# Patient Record
Sex: Male | Born: 1970 | Race: White | Hispanic: No | Marital: Married | Smoking: Never smoker
Health system: Southern US, Community
[De-identification: ages and names within clinical notes are randomized; demographics above are authoritative.]

## PROBLEM LIST (undated history)

## (undated) DIAGNOSIS — M419 Scoliosis, unspecified: Secondary | ICD-10-CM

## (undated) HISTORY — PX: BACK SURGERY: SHX140

---

## 2015-07-01 ENCOUNTER — Emergency Department: Payer: Worker's Compensation

## 2015-07-01 ENCOUNTER — Emergency Department
Admission: EM | Admit: 2015-07-01 | Discharge: 2015-07-01 | Disposition: A | Discharge status: Home / Self Care | Payer: Worker's Compensation | Attending: Emergency Medicine | Admitting: Emergency Medicine

## 2015-07-01 DIAGNOSIS — T148XXA Other injury of unspecified body region, initial encounter: Secondary | ICD-10-CM

## 2015-07-01 DIAGNOSIS — S7012XA Contusion of left thigh, initial encounter: Secondary | ICD-10-CM | POA: Diagnosis not present

## 2015-07-01 DIAGNOSIS — Y9289 Other specified places as the place of occurrence of the external cause: Secondary | ICD-10-CM | POA: Diagnosis not present

## 2015-07-01 DIAGNOSIS — M419 Scoliosis, unspecified: Secondary | ICD-10-CM | POA: Insufficient documentation

## 2015-07-01 DIAGNOSIS — W14XXXA Fall from tree, initial encounter: Secondary | ICD-10-CM | POA: Insufficient documentation

## 2015-07-01 DIAGNOSIS — Y998 Other external cause status: Secondary | ICD-10-CM | POA: Diagnosis not present

## 2015-07-01 DIAGNOSIS — Y9389 Activity, other specified: Secondary | ICD-10-CM | POA: Insufficient documentation

## 2015-07-01 DIAGNOSIS — W19XXXA Unspecified fall, initial encounter: Secondary | ICD-10-CM

## 2015-07-01 DIAGNOSIS — S7011XA Contusion of right thigh, initial encounter: Secondary | ICD-10-CM | POA: Insufficient documentation

## 2015-07-01 DIAGNOSIS — G8929 Other chronic pain: Secondary | ICD-10-CM | POA: Diagnosis not present

## 2015-07-01 DIAGNOSIS — S3992XA Unspecified injury of lower back, initial encounter: Secondary | ICD-10-CM | POA: Diagnosis present

## 2015-07-01 DIAGNOSIS — S300XXA Contusion of lower back and pelvis, initial encounter: Secondary | ICD-10-CM | POA: Insufficient documentation

## 2015-07-01 HISTORY — DX: Scoliosis, unspecified: M41.9

## 2015-07-01 LAB — BASIC METABOLIC PANEL
Anion gap: 3 — ABNORMAL LOW (ref 5–15)
BUN: 19 mg/dL (ref 6–20)
CALCIUM: 8.5 mg/dL — AB (ref 8.9–10.3)
CO2: 26 mmol/L (ref 22–32)
CREATININE: 0.65 mg/dL (ref 0.61–1.24)
Chloride: 107 mmol/L (ref 101–111)
Glucose, Bld: 98 mg/dL (ref 65–99)
Potassium: 3.9 mmol/L (ref 3.5–5.1)
SODIUM: 136 mmol/L (ref 135–145)

## 2015-07-01 LAB — CBC
HCT: 34.4 % — ABNORMAL LOW (ref 40.0–52.0)
Hemoglobin: 11.8 g/dL — ABNORMAL LOW (ref 13.0–18.0)
MCH: 29.2 pg (ref 26.0–34.0)
MCHC: 34.3 g/dL (ref 32.0–36.0)
MCV: 85 fL (ref 80.0–100.0)
PLATELETS: 256 10*3/uL (ref 150–440)
RBC: 4.05 MIL/uL — AB (ref 4.40–5.90)
RDW: 13.9 % (ref 11.5–14.5)
WBC: 9.6 10*3/uL (ref 3.8–10.6)

## 2015-07-01 MED ORDER — DIATRIZOATE MEGLUMINE & SODIUM 66-10 % PO SOLN
15.0000 mL | Freq: Once | ORAL | Status: AC
  Administered 2015-07-01: 15 mL via ORAL

## 2015-07-01 MED ORDER — HYDROCODONE-ACETAMINOPHEN 5-325 MG PO TABS: 1.0000 | ORAL_TABLET | Freq: Four times a day (QID) | ORAL | Status: DC | PRN

## 2015-07-01 MED ORDER — HYDROCODONE-ACETAMINOPHEN 5-325 MG PO TABS
ORAL_TABLET | ORAL | Status: AC
  Filled 2015-07-01: qty 2

## 2015-07-01 MED ORDER — HYDROCODONE-ACETAMINOPHEN 5-325 MG PO TABS
2.0000 | ORAL_TABLET | ORAL | Status: AC
  Administered 2015-07-01: 2 via ORAL

## 2015-07-01 MED ORDER — IOPAMIDOL (ISOVUE-300) INJECTION 61%
100.0000 mL | Freq: Once | INTRAVENOUS | Status: AC | PRN
  Administered 2015-07-01: 100 mL via INTRAVENOUS
  Filled 2015-07-01: qty 100

## 2015-07-01 NOTE — Discharge Instructions (Signed)
SEEK IMMEDIATE MEDICAL CARE IF:  You have increasing pain, or your pain is not controlled with medicine.  You have a fever.  You have worsening swelling or discoloration.  Your skin over the hematoma breaks or starts bleeding.  Your hematoma is in your chest or abdomen and you have weakness, shortness of breath, or a change in consciousness.

## 2015-07-01 NOTE — ED Notes (Signed)
Pt states he was strapped into a cage on a forklift up to 12-14 ft,  the cage with him in it fell from the lift with the cage landing on top of him across his pelvis on friday.. Pt states he has not had an medical attention since accident.. Pt has severe bruising of BL legs.Nicholas Merritt.denies any numbness or tingling..Nicholas Merritt

## 2015-07-01 NOTE — ED Provider Notes (Signed)
[facility] Emergency Department Provider Note  ____________________________________________  Time seen: Approximately 3:36 PM  I have reviewed the triage vital signs and the nursing notes.   HISTORY  Chief Complaint Fall    HPI Nicholas Merritt is a 45 y.o. male presents for evaluation of bruising.  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 He fell about 10-14 feet. He did not hit his head injuries neck injured his chest or arms.  He reports that he fell hard with the bar of the basket he was in striking him along the buttock and lower back. He's been having achy pain since that time in his lower back and along the left buttock. Over the last week he has developed a fair amount of bruising over the buttocks and upper thighs.  He has continued to work. He does report that the left buttock was quite swollen, and the swelling seems to be slowly improving. Overall his pain and achiness is slowly improving. He is able to walk without difficulty. No numbness tingling.  Does not take any blood thinners.   Past Medical History  Diagnosis Date  . Scoliosis     There are no active problems to display for this patient.   Past Surgical History  Procedure Laterality Date  . Back surgery      Current Outpatient Rx  Name  Route  Sig  Dispense  Refill  . HYDROcodone-acetaminophen (NORCO/VICODIN) 5-325 MG tablet   Oral   Take 1 tablet by mouth every 6 (six) hours as needed for moderate pain.   15 tablet   0     Allergies Bee pollen  No family history on file.  Social History Social History  Substance Use Topics  . Smoking status: Never Smoker   . Smokeless tobacco: None  . Alcohol Use: Yes    Review of Systems Constitutional: No fever/chills. Eyes: No visual changes. ENT: No sore throat. Cardiovascular: Denies chest pain. Respiratory: Denies shortness of breath. Gastrointestinal: No abdominal pain.  No  nausea, no vomiting.  No diarrhea.  No constipation. Genitourinary: Negative for dysuria. Musculoskeletal: See history of present illness, also has chronic back pain from severe scoliosis Skin: Negative for rash except for bruising. Neurological: Negative for headaches, focal weakness or numbness.  10-point ROS otherwise negative.  ____________________________________________   PHYSICAL EXAM:  VITAL SIGNS: ED Triage Vitals  Enc Vitals Group     BP 07/01/15 1205 162/96 mmHg     Pulse Rate 07/01/15 1205 80     Resp 07/01/15 1205 18     Temp 07/01/15 1205 98.4 F (36.9 C)     Temp Source 07/01/15 1205 Oral     SpO2 07/01/15 1205 97 %     Weight 07/01/15 1205 165 lb (74.844 kg)     Height 07/01/15 1205 5\' 8"  (1.727 m)     Head Cir --      Peak Flow --      Pain Score 07/01/15 1206 6     Pain Loc --      Pain Edu? --      Excl. in GC? --    Constitutional: Alert and oriented. Well appearing and in no acute distress. Very pleasant. Eyes: Conjunctivae are normal. PERRL. EOMI. Head: Atraumatic. Nose: No congestion/rhinnorhea. Mouth/Throat: Mucous membranes are moist.  Oropharynx non-erythematous. Neck: No stridor.  No cervical tenderness Cardiovascular: Normal rate, regular rhythm. Grossly normal heart sounds.  Good peripheral circulation.  Respiratory: Normal respiratory effort.  No retractions. Lungs CTAB. Gastrointestinal: Soft and nontender. No distention. No abdominal bruits. No CVA tenderness. Musculoskeletal: Patient has a somewhat fluctuant approximately hand sized hematoma or swelling located over the left buttock with bruising over it posteriorly involving the back of the left thigh area in addition he has moderate bruising on the right buttock without obvious swelling, extending down towards the right upper thigh. He has ecchymosis over the lower pelvis which appears dependent in nature with ongoing ecchymosis to about the mid thigh anteriorly bilateral. No lower extremity  tenderness nor edema.  No joint effusions. Neurologic:  Normal speech and language. No gross focal neurologic deficits are appreciated. No gait instability. Skin:  Skin is warm, dry and intact. No rash noted. Psychiatric: Mood and affect are normal. Speech and behavior are normal.  ____________________________________________   LABS (all labs ordered are listed, but only abnormal results are displayed)  Labs Reviewed  BASIC METABOLIC PANEL - Abnormal; Notable for the following:    Calcium 8.5 (*)    Anion gap 3 (*)    All other components within normal limits  CBC - Abnormal; Notable for the following:    RBC 4.05 (*)    Hemoglobin 11.8 (*)    HCT 34.4 (*)    All other components within normal limits   ____________________________________________  EKG   ____________________________________________  RADIOLOGY   CT Abdomen Pelvis W Contrast (Final result) Result time: 07/01/15 18:17:18   Final result by Rad Results In Interface (07/01/15 18:17:18)   Narrative:   CLINICAL DATA: Trauma 1 week prior. Swelling and ecchymosis in the left lower buttock and thigh.  EXAM: CT ABDOMEN AND PELVIS WITH CONTRAST  TECHNIQUE: Multidetector CT imaging of the abdomen and pelvis was performed using the standard protocol following bolus administration of intravenous contrast.  CONTRAST: 100mL ISOVUE-300 IOPAMIDOL (ISOVUE-300) INJECTION 61%  COMPARISON: No prior CT abdomen/ pelvis.  FINDINGS: Lower chest: Moderate cylindrical bronchiectasis in the lingula and left lower lobe.  Hepatobiliary: Normal liver with no liver laceration or mass. Normal gallbladder with no radiopaque cholelithiasis. No biliary ductal dilatation.  Pancreas: Normal, with no laceration, mass or duct dilation.  Spleen: Normal size. No laceration or mass.  Adrenals/Urinary Tract: Normal adrenals. No hydronephrosis. No renal laceration. No renal mass. Normal bladder.  Stomach/Bowel: Grossly normal  stomach. Normal caliber small bowel with no small bowel wall thickening. Appendix is not discretely visualized. Normal large bowel with no diverticulosis, large bowel wall thickening or pericolonic fat stranding.  Vascular/Lymphatic: Mildly atherosclerotic nonaneurysmal abdominal aorta. Patent portal, splenic, hepatic and renal veins. No pathologically enlarged lymph nodes in the abdomen or pelvis.  Reproductive: Normal size prostate.  Other: No pneumoperitoneum, ascites or focal fluid collection.  Musculoskeletal: No aggressive appearing focal osseous lesions. Patient is status post posterior thoracolumbar spine fusion extending inferiorly to the L3 level, with no evidence of hardware fracture or loosening. In the proximal left posterior lateral thigh deep subcutaneous soft tissues, there is a 12.9 x 3.6 x 9.6 cm hematoma with heterogeneous internal blood products and with surrounding subcutaneous contusion. No fracture in the abdomen or pelvis.  IMPRESSION: 1. Large deep subcutaneous hematoma in the proximal left posterolateral thigh. No fracture. 2. Otherwise no acute traumatic injury in the abdomen or pelvis. 3. Moderate cylindrical bronchiectasis at the left lung base.   Electronically Signed By: Delbert PhenixJason A Poff M.D. On: 07/01/2015 18:17          DG Lumbar Spine Complete (Final result) Result time:  07/01/15 13:13:11   Final result by Rad Results In Interface (07/01/15 13:13:11)   Narrative:   CLINICAL DATA: Fall several feet from forklift with low back pain, initial encounter  EXAM: LUMBAR SPINE - COMPLETE 4+ VIEW  COMPARISON: None.  FINDINGS: Vertebral body height is well maintained. Mild osteophytic changes are noted. The known spinal hardware appears intact. There remains a scoliosis concave to the left at the thoracolumbar junction. Some compensatory facet changes are noted. No acute fracture is seen.  IMPRESSION: Chronic changes without acute  abnormality.   Electronically Signed By: Alcide Clever M.D. On: 07/01/2015 13:13          DG Thoracic Spine 2 View (Final result) Result time: 07/01/15 13:23:04   Final result by Rad Results In Interface (07/01/15 13:23:04)   Narrative:   CLINICAL DATA: Fall 14 feet. Thoracic spine surgery 30 years ago.  EXAM: THORACIC SPINE 2 VIEWS  COMPARISON: None.  FINDINGS: There is fairly severe dextroscoliosis of the thoracic spine, measuring approximately 50 degrees, and kyphosis. Fixation rods are in place. The rod superior intact and appropriately positioned, although the lower aspects are incompletely imaged as they presumably extend into the lumbar spine as well.  There is no fracture line or displaced fracture fragment seen. No evidence of an acute vertebral body subluxation. Mild degenerative spurring is seen within the upper thoracic spine. Paravertebral soft tissues are unremarkable.  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 Fixation hardware appears intact and appropriately positioned, incompletely imaged. No fracture or acute subluxation identified within the thoracic spine.   Electronically Signed By: Bary Richard M.D. On: 07/01/2015 13:23          DG Pelvis 1-2 Views (Final result) Result time: 07/01/15 13:14:06   Final result by Rad Results In Interface (07/01/15 13:14:06)   Narrative:   CLINICAL DATA: Fall from forklift several feet with pelvic pain, initial encounter  EXAM: PELVIS - 1-2 VIEW  COMPARISON: None.  FINDINGS: There is no evidence of pelvic fracture or diastasis. No pelvic bone lesions are seen.  IMPRESSION: No acute abnormality noted.   Electronically Signed By: Alcide Clever M.D. On: 07/01/2015 13:14       ____________________________________________   PROCEDURES  Procedure(s) performed: None  Critical Care performed:  No  ____________________________________________   INITIAL IMPRESSION / ASSESSMENT AND PLAN / ED COURSE  Pertinent labs & imaging results that were available during my care of the patient were reviewed by me and considered in my medical decision making (see chart for details).  Patient presents for evaluation after a fall one week ago. He has significant ecchymosis over his buttock and upper legs bilateral, but also with area of edema and fluctuance over the left posterior buttock. It is somewhat reassuring week after his injury that she is not unstable, however I am concerned about the significance of his ecchymosis. Denies any injury to the chest head or neck or upper extremities. He does however demonstrate evidence of traumatic injury including significant bruising over the lower buttock and back. Discussed with the patient and we will obtain CT abdomen and pelvis as well as include upper thighs to evaluate for possible vascular abnormality or hematoma or other traumatic injury.  ----------------------------------------- 6:31 PM on 07/01/2015 -----------------------------------------  Patient awake and alert. No change in condition, he has no complaint at this time except does report his pain is improved. I discussed with him hematoma, careful return precautions, and he is very agreeable.  Overall, he does report  that the hematoma seems to be improving as he reports the swelling has gone down somewhat over the last few days. No evidence of active bleeding or extravasation in this injury occurred about one week ago, for which his hemoglobin is just slightly low 11 today but I doubt there is any acute or ongoing bleeding into this hematoma.  Careful return precautions discussed with patient and wife.  Return precautions and treatment recommendations and follow-up discussed with the patient who is agreeable with the plan.  I will prescribe the patient a narcotic pain medicine due to their  condition which I anticipate will cause at least moderate pain short term. I discussed with the patient safe use of narcotic pain medicines, and that they are not to drive, work in dangerous areas, or ever take more than prescribed (no more than 1 pill every 6 hours). We discussed that this is the type of medication that can be  overdosed on and the risks of this type of medicine. Patient is very agreeable to only use as prescribed and to never use more than prescribed.  ____________________________________________   FINAL CLINICAL IMPRESSION(S) / ED DIAGNOSES  Final diagnoses:  Hematoma and contusion      Sharyn Creamer, MD 07/01/15 [phone]

## 2017-10-22 IMAGING — CR DG PELVIS 1-2V
1 series · 1 of 1 positions shown · non-contrast
Comparison: None.

CLINICAL DATA: Fall from forklift several feet with pelvic pain,
initial encounter

EXAM:
PELVIS - 1-2 VIEW

[dg pelvis 1-2 views]
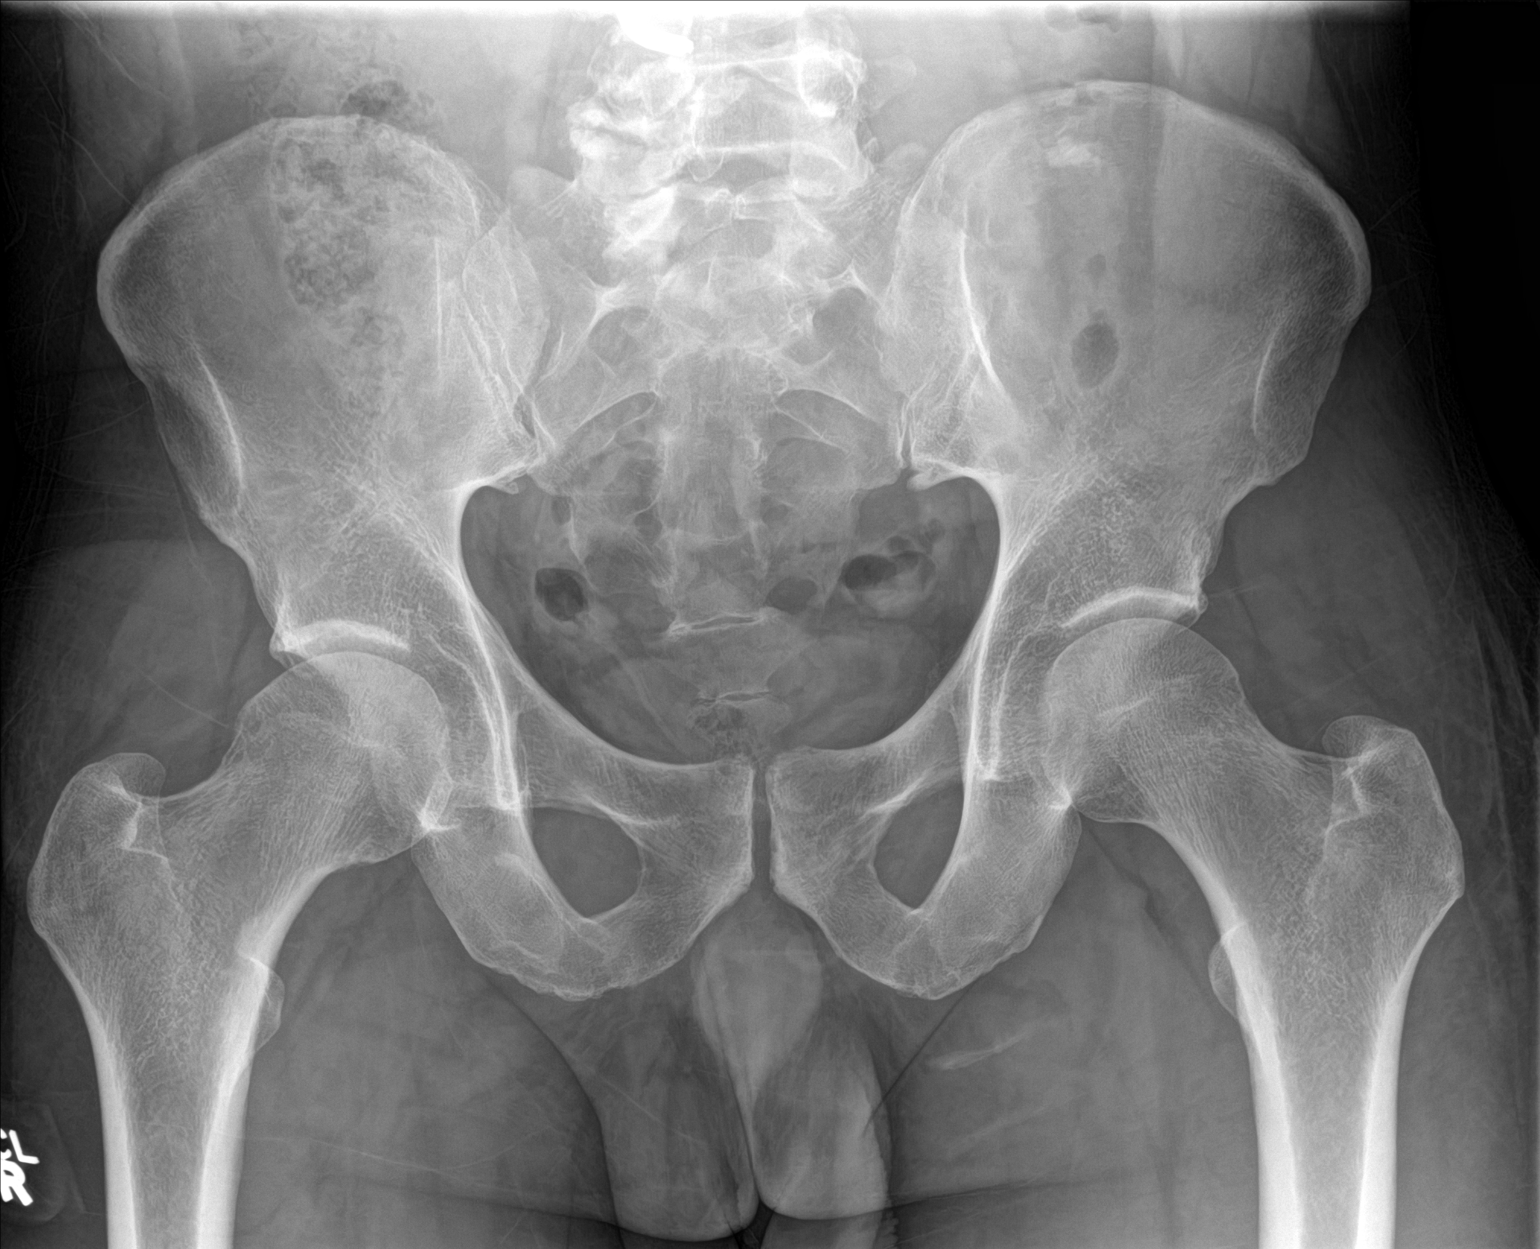

[1 of 1 positions shown; findings below may reference images not displayed]

FINDINGS: There is no evidence of pelvic fracture or diastasis. No pelvic bone
lesions are seen.
IMPRESSION: No acute abnormality noted.

## 2017-10-22 IMAGING — CR DG LUMBAR SPINE COMPLETE 4+V
1 series · 5 of 5 positions shown · non-contrast
Comparison: None.

CLINICAL DATA: Fall several feet from forklift with low back pain,
initial encounter

EXAM:
LUMBAR SPINE - COMPLETE 4+ VIEW

[Series 1: dg lumbar spine complete 4 +v · 0.14mm/px · 5 of 5 slices shown]
[im 1/5]
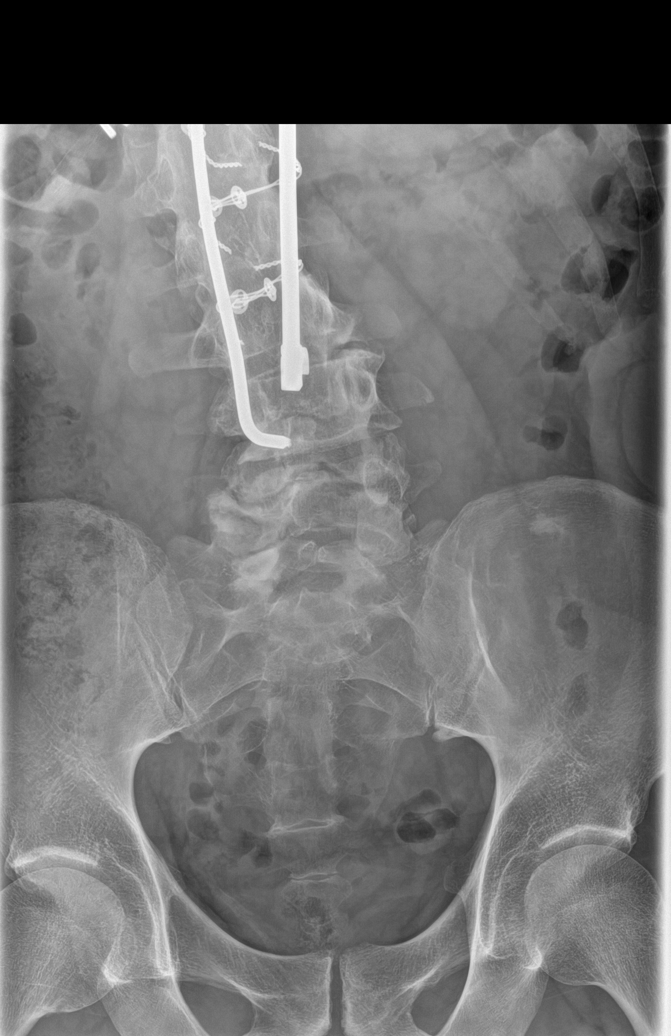
[im 2/5]
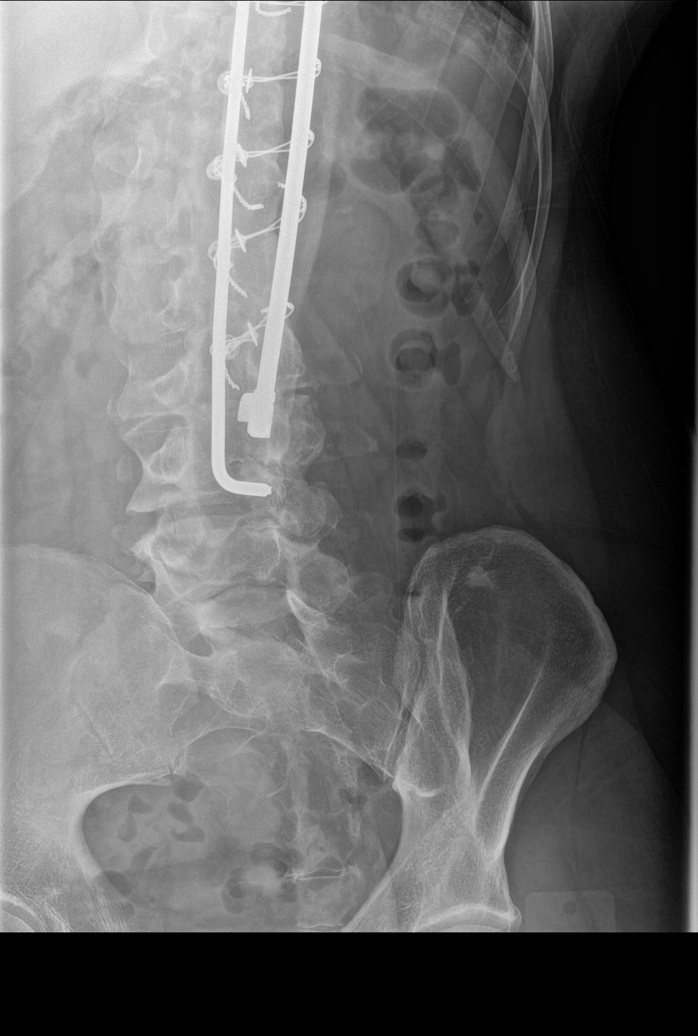
[im 3/5]
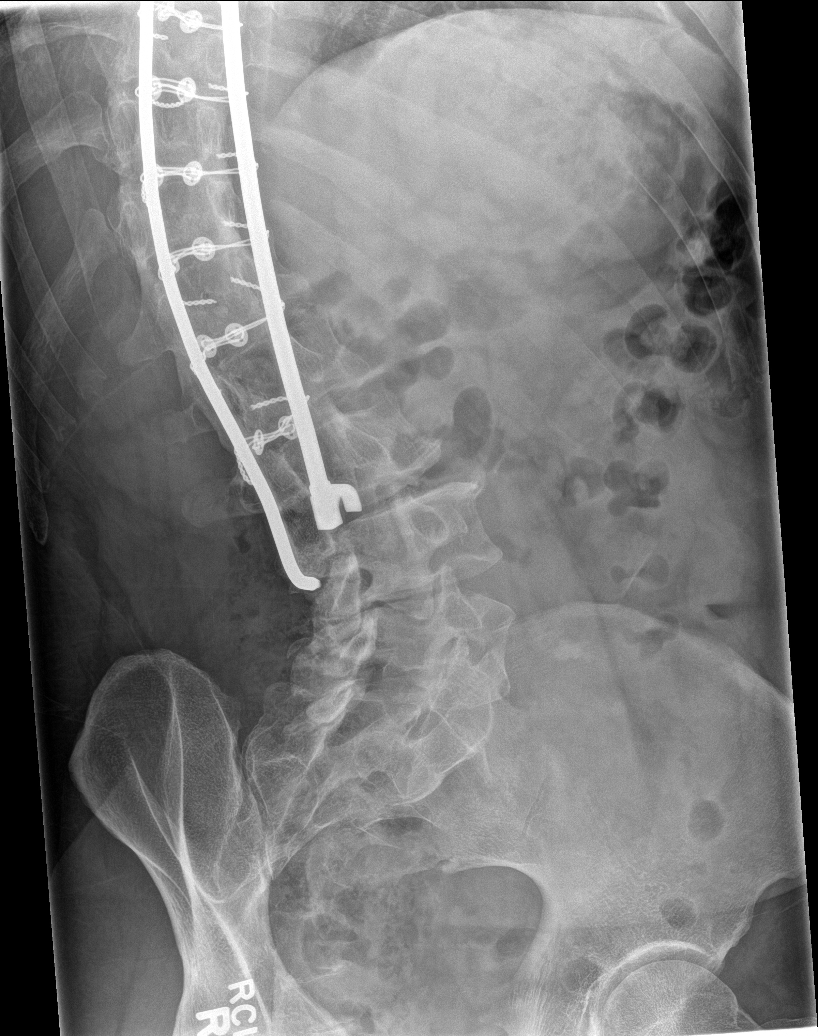
[im 4/5]
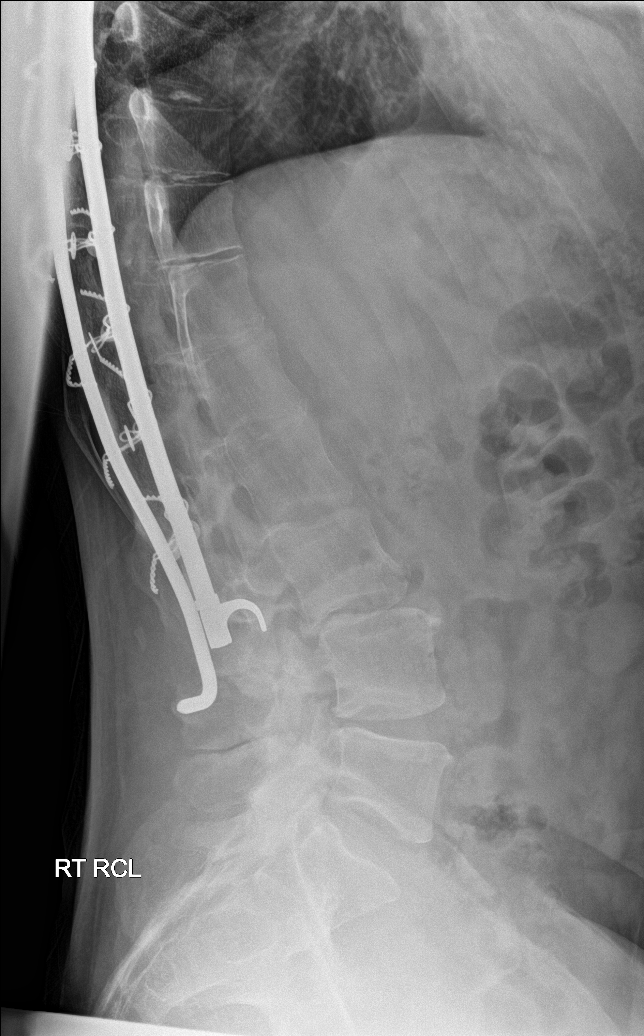
[im 5/5]
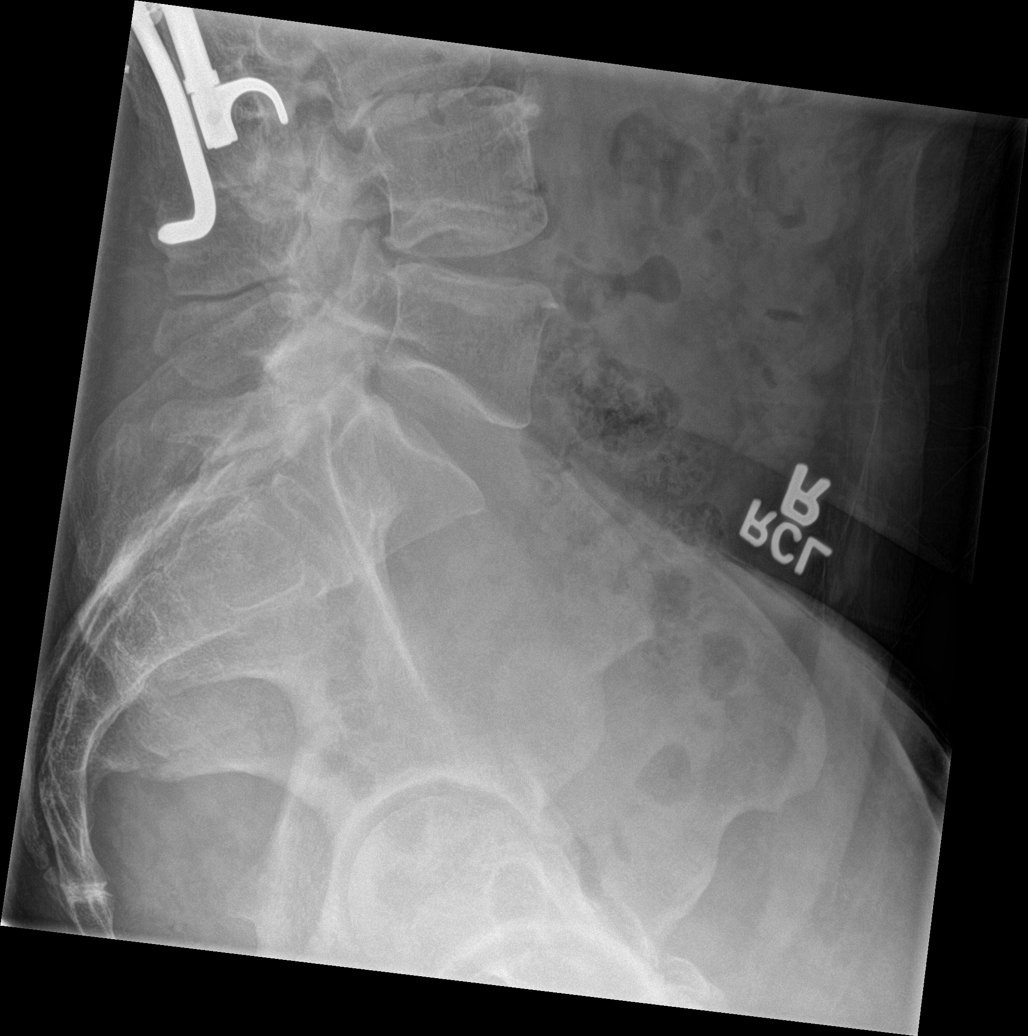

[5 of 5 positions shown; findings below may reference images not displayed]

FINDINGS: Vertebral body height is well maintained. Mild osteophytic changes
are noted. The known spinal hardware appears intact. There remains a
scoliosis concave to the left at the thoracolumbar junction. Some
compensatory facet changes are noted. No acute fracture is seen.
IMPRESSION: Chronic changes without acute abnormality.

## 2019-04-07 ENCOUNTER — Ambulatory Visit
Admission: EM | Admit: 2019-04-07 | Discharge: 2019-04-07 | Disposition: A | Discharge status: Home / Self Care | Payer: No Typology Code available for payment source | Attending: Internal Medicine | Admitting: Internal Medicine

## 2019-04-07 ENCOUNTER — Other Ambulatory Visit: Payer: Self-pay

## 2019-04-07 DIAGNOSIS — B349 Viral infection, unspecified: Secondary | ICD-10-CM | POA: Diagnosis present

## 2019-04-07 DIAGNOSIS — R519 Headache, unspecified: Secondary | ICD-10-CM

## 2019-04-07 DIAGNOSIS — U071 COVID-19: Secondary | ICD-10-CM | POA: Insufficient documentation

## 2019-04-07 DIAGNOSIS — R6883 Chills (without fever): Secondary | ICD-10-CM

## 2019-04-07 DIAGNOSIS — R05 Cough: Secondary | ICD-10-CM

## 2019-04-07 DIAGNOSIS — R110 Nausea: Secondary | ICD-10-CM

## 2019-04-07 DIAGNOSIS — Z20822 Contact with and (suspected) exposure to covid-19: Secondary | ICD-10-CM

## 2019-04-07 DIAGNOSIS — R5383 Other fatigue: Secondary | ICD-10-CM

## 2019-04-07 DIAGNOSIS — M791 Myalgia, unspecified site: Secondary | ICD-10-CM

## 2019-04-07 MED ORDER — ONDANSETRON 8 MG PO TBDP: 8.0000 mg | ORAL_TABLET | Freq: Three times a day (TID) | ORAL | 0 refills | Status: AC | PRN

## 2019-04-07 NOTE — ED Triage Notes (Signed)
Pt with past few days of nausea, low appetite, sleeping more than usual, intermittent headache.

## 2019-04-07 NOTE — ED Provider Notes (Signed)
[facility]    CSN: [id number] Arrival date & time: 04/07/19  1355      History   Chief Complaint Chief Complaint  Patient presents with  . Nausea    HPI Nicholas Merritt is a 49 y.o. male.   Subjective:   Nicholas Merritt is a 49 y.o. male who presents for evaluation of influenza/COVID-like like symptoms. Symptoms include hot and cold spells, chills, dry cough, headache, myalgias, nausea, decreased appetitie and productive cough. Symptoms have been present for 2 days.  He denies any sore throat, shortness of breath, vomiting, diarrhea, dizziness or change in taste/smell.  He has not tried anything to help alleviate his symptoms.  He denies any known exposure to COVID-19.  No high risk factors for COVID-19 complications have been identified.   The following portions of the patient's history were reviewed and updated as appropriate: allergies, current medications, past family history, past medical history, past social history, past surgical history and problem list.           Past Medical History:  Diagnosis Date  . Scoliosis     There are no problems to display for this patient.   Past Surgical History:  Procedure Laterality Date  . BACK SURGERY         Home Medications    Prior to Admission medications   Medication Sig Start Date End Date Taking? Authorizing Provider  ondansetron (ZOFRAN ODT) 8 MG disintegrating tablet Take 1 tablet (8 mg total) by mouth every 8 (eight) hours as needed for nausea or vomiting. 04/07/19   Lurline Idol, FNP    Family History History reviewed. No pertinent family history.  Social History Social History   Tobacco Use  . Smoking status: Never Smoker  . Smokeless tobacco: Never Used  Substance Use Topics  . Alcohol use: Yes    Comment: social  . Drug use: Yes    Types: Marijuana    Comment: last use 3 days ago      Allergies   Bee pollen   Review of Systems Review of Systems  Constitutional: Positive  for chills, fatigue and fever.  HENT: Negative for sore throat.   Respiratory: Positive for cough. Negative for shortness of breath.   Gastrointestinal: Positive for nausea. Negative for diarrhea and vomiting.  Musculoskeletal: Positive for myalgias.  Neurological: Positive for headaches. Negative for dizziness.  All other systems reviewed and are negative.    Physical Exam Triage Vital Signs ED Triage Vitals  Enc Vitals Group     BP 04/07/19 1411 (!) 133/106     Pulse Rate 04/07/19 1411 98     Resp 04/07/19 1411 15     Temp 04/07/19 1411 98.5 F (36.9 C)     Temp Source 04/07/19 1411 Oral     SpO2 04/07/19 1411 97 %     Weight 04/07/19 1408 165 lb (74.8 kg)     Height 04/07/19 1408 5\' 8"  (1.727 m)     Head Circumference --      Peak Flow --      Pain Score 04/07/19 1408 0     Pain Loc --      Pain Edu? --      Excl. in GC? --    No data found.  Updated Vital Signs BP (!) 133/106 (BP Location: Right Arm)   Pulse 98   Temp 98.5 F (36.9 C) (Oral)   Resp 15   Ht 5\' 8"  (1.727 m)   Wt  165 lb (74.8 kg)   SpO2 97%   BMI 25.09 kg/m   Visual Acuity Right Eye Distance:   Left Eye Distance:   Bilateral Distance:    Right Eye Near:   Left Eye Near:    Bilateral Near:     Physical Exam Vitals reviewed.  Constitutional:      General: He is not in acute distress.    Appearance: Normal appearance. He is not ill-appearing, toxic-appearing or diaphoretic.  HENT:     Head: Normocephalic.  Cardiovascular:     Rate and Rhythm: Normal rate and regular rhythm.  Pulmonary:     Effort: Pulmonary effort is normal.  Musculoskeletal:     Cervical back: Normal range of motion and neck supple.  Skin:    General: Skin is warm and dry.  Neurological:     General: No focal deficit present.     Mental Status: He is alert and oriented to person, place, and time.  Psychiatric:        Mood and Affect: Mood normal.        Behavior: Behavior normal.      UC Treatments /  Results  Labs (all labs ordered are listed, but only abnormal results are displayed) Labs Reviewed  NOVEL CORONAVIRUS, NAA (HOSP ORDER, SEND-OUT TO REF LAB; TAT 18-24 HRS)    EKG   Radiology No results found.  Procedures Procedures (including critical care time)  Medications Ordered in UC Medications - No data to display  Initial Impression / Assessment and Plan / UC Course  I have reviewed the triage vital signs and the nursing notes.  Pertinent labs & imaging results that were available during my care of the patient were reviewed by me and considered in my medical decision making (see chart for details).    49 year old male presenting with a 2-day history of chills, cough, headache, myalgias, nausea, anorexia and productive cough.  He is afebrile.  Nontoxic-appearing.  Covid test pending. Supportive care and isolation per CDC guidelines.  Today's evaluation has revealed no signs of a dangerous process. Discussed diagnosis with patient and/or guardian. Patient and/or guardian aware of their diagnosis, possible red flag symptoms to watch out for and need for close follow up. Patient and/or guardian understands verbal and written discharge instructions. Patient and/or guardian comfortable with plan and disposition.  Patient and/or guardian has a clear mental status at this time, good insight into illness (after discussion and teaching) and has clear judgment to make decisions regarding their care  This care was provided during an unprecedented National Emergency due to the Novel Coronavirus (COVID-19) pandemic. COVID-19 infections and transmission risks place heavy strains on healthcare resources.  As this pandemic evolves, our facility, providers, and staff strive to respond fluidly, to remain operational, and to provide care relative to available resources and information. Outcomes are unpredictable and treatments are without well-defined guidelines. Further, the impact of COVID-19 on  all aspects of urgent care, including the impact to patients seeking care for reasons other than COVID-19, is unavoidable during this national emergency. At this time of the global pandemic, management of patients has significantly changed, even for non-COVID positive patients given high local and regional COVID volumes at this time requiring high healthcare system and resource utilization. The standard of care for management of both COVID suspected and non-COVID suspected patients continues to change rapidly at the local, regional, national, and global levels. This patient was worked up and treated to the best available but ever changing evidence  and resources available at this current time.   Documentation was completed with the aid of voice recognition software. Transcription may contain typographical errors. Final Clinical Impressions(s) / UC Diagnoses   Final diagnoses:  Viral syndrome  Encounter for laboratory testing for COVID-19 virus     Discharge Instructions     Take medications as prescribed. You may take tylenol or ibuprofen as needed for fevers/headache/body aches. Drink plenty of fluids. Stay in home isolation until you receive results of your COVID test. You will only be notified for positive results. You may go online to MyChart in the next few days and review your results. Please follow CDC guidelines that are attached. You may discontinue home isolation when there has been at least 10 days since symptoms onset AND 3 days fever free without antipyretics (Tylenol or Ibuprofen) AND an overall improvement in your symptoms. Go to the ED immediately if you get worse or have any other symptoms.   Feel better soon!  Lelon Mast, FNP-C      ED Prescriptions    Medication Sig Dispense Auth. Provider   ondansetron (ZOFRAN ODT) 8 MG disintegrating tablet Take 1 tablet (8 mg total) by mouth every 8 (eight) hours as needed for nausea or vomiting. 20 tablet Lurline Idol, FNP     PDMP  not reviewed this encounter.   Lurline Idol, [place] 04/07/19 1430

## 2019-04-07 NOTE — Discharge Instructions (Signed)
Take medications as prescribed. You may take tylenol or ibuprofen as needed for fevers/headache/body aches. Drink plenty of fluids. Stay in home isolation until you receive results of your COVID test. You will only be notified for positive results. You may go online to MyChart in the next few days and review your results. Please follow CDC guidelines that are attached. You may discontinue home isolation when there has been at least 10 days since symptoms onset AND 3 days fever free without antipyretics (Tylenol or Ibuprofen) AND an overall improvement in your symptoms. Go to the ED immediately if you get worse or have any other symptoms.   Feel better soon!  Preesha Benjamin, FNP-C   

## 2019-04-08 ENCOUNTER — Other Ambulatory Visit: Payer: Self-pay

## 2019-04-08 ENCOUNTER — Telehealth: Payer: Self-pay | Admitting: Emergency Medicine

## 2019-04-08 NOTE — Telephone Encounter (Signed)
Your test for COVID-19 was positive, meaning that you were infected with the novel coronavirus and could give the germ to others.  Please continue isolation at home, for at least 10 days since the start of your fever/cough/breathlessness and until you have had 3 consecutive days without fever (without taking a fever reducer) and with cough/breathlessness improving. Please continue good preventive care measures, including:  frequent hand-washing, avoid touching your face, cover coughs/sneezes, stay out of crowds and keep a 6 foot distance from others.  Recheck or go to the nearest hospital ED tent for re-assessment if fever/cough/breathlessness return.  Patient contacted and made aware of all results, all questions answered.   

## 2019-04-09 LAB — NOVEL CORONAVIRUS, NAA (HOSP ORDER, SEND-OUT TO REF LAB; TAT 18-24 HRS): SARS-CoV-2, NAA: DETECTED — AB

## 2023-07-11 DIAGNOSIS — H579 Unspecified disorder of eye and adnexa: Secondary | ICD-10-CM | POA: Diagnosis not present

## 2023-07-11 DIAGNOSIS — Z1331 Encounter for screening for depression: Secondary | ICD-10-CM | POA: Diagnosis not present

## 2023-07-11 DIAGNOSIS — Z1330 Encounter for screening examination for mental health and behavioral disorders, unspecified: Secondary | ICD-10-CM | POA: Diagnosis not present

## 2023-07-11 DIAGNOSIS — I10 Essential (primary) hypertension: Secondary | ICD-10-CM | POA: Diagnosis not present

## 2023-07-11 DIAGNOSIS — Z131 Encounter for screening for diabetes mellitus: Secondary | ICD-10-CM | POA: Diagnosis not present

## 2023-07-11 DIAGNOSIS — Z125 Encounter for screening for malignant neoplasm of prostate: Secondary | ICD-10-CM | POA: Diagnosis not present

## 2023-07-11 DIAGNOSIS — Z202 Contact with and (suspected) exposure to infections with a predominantly sexual mode of transmission: Secondary | ICD-10-CM | POA: Diagnosis not present

## 2023-07-11 DIAGNOSIS — Z136 Encounter for screening for cardiovascular disorders: Secondary | ICD-10-CM | POA: Diagnosis not present

## 2023-08-03 DIAGNOSIS — R0683 Snoring: Secondary | ICD-10-CM | POA: Diagnosis not present

## 2023-08-03 DIAGNOSIS — I10 Essential (primary) hypertension: Secondary | ICD-10-CM | POA: Diagnosis not present

## 2023-08-03 DIAGNOSIS — Z23 Encounter for immunization: Secondary | ICD-10-CM | POA: Diagnosis not present

## 2023-08-03 DIAGNOSIS — R0681 Apnea, not elsewhere classified: Secondary | ICD-10-CM | POA: Diagnosis not present
